# Patient Record
Sex: Female | Born: 1995 | Race: Black or African American | Hispanic: No | Marital: Single
Health system: Midwestern US, Community
[De-identification: ages and names within clinical notes are randomized; demographics above are authoritative.]

## PROBLEM LIST (undated history)

## (undated) DIAGNOSIS — J029 Acute pharyngitis, unspecified: Secondary | ICD-10-CM

## (undated) DIAGNOSIS — T7840XA Allergy, unspecified, initial encounter: Secondary | ICD-10-CM

## (undated) HISTORY — DX: Allergy, unspecified, initial encounter: T78.40XA

## (undated) MED ORDER — NAPROXEN 500 MG TAB
500 mg | ORAL_TABLET | Freq: Two times a day (BID) | ORAL | Status: AC
Start: ? — End: 2012-03-06

---
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
## 2015-01-08 ENCOUNTER — Ambulatory Visit (INDEPENDENT_AMBULATORY_CARE_PROVIDER_SITE_OTHER): Payer: BLUE CROSS/BLUE SHIELD | Admitting: Urgent Care

## 2015-01-08 ENCOUNTER — Ambulatory Visit (INDEPENDENT_AMBULATORY_CARE_PROVIDER_SITE_OTHER): Payer: BLUE CROSS/BLUE SHIELD

## 2015-01-08 ENCOUNTER — Encounter: Payer: Self-pay | Admitting: Urgent Care

## 2015-01-08 VITALS — BP 103/69 | HR 81 | Temp 98.7°F | Resp 16 | Ht 66.25 in | Wt 176.0 lb

## 2015-01-08 DIAGNOSIS — R059 Cough, unspecified: Secondary | ICD-10-CM

## 2015-01-08 DIAGNOSIS — R05 Cough: Secondary | ICD-10-CM

## 2015-01-08 DIAGNOSIS — R062 Wheezing: Secondary | ICD-10-CM | POA: Diagnosis not present

## 2015-01-08 DIAGNOSIS — J988 Other specified respiratory disorders: Secondary | ICD-10-CM

## 2015-01-08 DIAGNOSIS — J22 Unspecified acute lower respiratory infection: Secondary | ICD-10-CM

## 2015-01-08 MED ORDER — BENZONATATE 100 MG PO CAPS: 100.0000 mg | ORAL_CAPSULE | Freq: Three times a day (TID) | ORAL | Status: DC | PRN

## 2015-01-08 MED ORDER — HYDROCODONE-HOMATROPINE 5-1.5 MG/5ML PO SYRP: 5.0000 mL | ORAL_SOLUTION | Freq: Every evening | ORAL | Status: DC | PRN

## 2015-01-08 MED ORDER — IPRATROPIUM BROMIDE HFA 17 MCG/ACT IN AERS: 2.0000 | INHALATION_SPRAY | Freq: Four times a day (QID) | RESPIRATORY_TRACT | Status: DC | PRN

## 2015-01-08 NOTE — Progress Notes (Signed)
    MRN: [id number] DOB: 1996-03-19  Subjective:   Michelle Vargas is a 19 y.o. female presenting for chief complaint of Cough and Sore Throat  Reports 3 day history sore throat, worsening productive cough. Cough is worse at night, cough elicits chest pain, is associated with shob, wheezing but without hemoptysis. Has tried thera-flu with some relief. Admits history of seasonal allergies, takes Claritin for this but not lately. Denies fever, n/v, abdominal pain, itchy or watery red eyes, sinus congestion, sinus pain, ear pain, ear drainage, tooth pain. Denies smoking cigarettes. Denies sick contacts. Denies any other aggravating or relieving factors, no other questions or concerns.  [employer] has a current medication list which includes the following prescription(s): norgestimate-ethinyl estradiol triphasic, famotidine, and prednisone. Also is allergic to coconut oil and peanuts.  Michelle Vargas  has a past medical history of Allergy. Also  has no past surgical history on file.  Objective:   Vitals: BP 103/69 mmHg  Pulse 81  Temp(Src) 98.7 F (37.1 C) (Oral)  Resp 16  Ht 5' 6.25" (1.683 m)  Wt 176 lb (79.833 kg)  BMI 28.18 kg/m2  LMP 12/26/2014  Physical Exam  Constitutional: She is oriented to person, place, and time. She appears well-developed and well-nourished.  HENT:  TM's intact bilaterally, no effusions or erythema. Nares patent, nasal turbinates pink and moist, nasal passages patent. No sinus tenderness. Oropharynx clear, mucous membranes moist, dentition in good repair.  Eyes: Conjunctivae are normal. Right eye exhibits no discharge. Left eye exhibits no discharge. No scleral icterus.  Neck: Normal range of motion. Neck supple.  Cardiovascular: Normal rate, regular rhythm and intact distal pulses.  Exam reveals no gallop and no friction rub.   No murmur heard. Pulmonary/Chest: No respiratory distress. She has no wheezes. She has no rales.  Poor respiratory effort. Patient is wheezing  through laryngospasm.  Lymphadenopathy:    She has no cervical adenopathy.  Neurological: She is alert and oriented to person, place, and time.  Skin: Skin is warm and dry. No rash noted. No erythema. No pallor.   [facility] reading (PRIMARY) by  Dr. Katrinka Vargas and PA-Michelle Vargas. Chest - no acute process.  Assessment and Plan :   1. Lower respiratory infection (e.g., bronchitis, pneumonia, pneumonitis, pulmonitis) 2. Cough 3. Wheezing - Likely undergoing viral process. Advised supportive care. Patient to call or rtc in ~1 week if no improvement, consider antibiotic course at that point.  Wallis Bamberg, PA-C [facility] Michelle Varg[facility] [phone] 01/08/2015 3:51 PM

## 2015-01-08 NOTE — Patient Instructions (Signed)
Cough, Adult  A cough is a reflex that helps clear your throat and airways. It can help heal the body or may be a reaction to an irritated airway. A cough may only last 2 or 3 weeks (acute) or may last more than 8 weeks (chronic).  CAUSES Acute cough:  Viral or bacterial infections. Chronic cough:  Infections.  Allergies.  Asthma.  Post-nasal drip.  Smoking.  Heartburn or acid reflux.  Some medicines.  Chronic lung problems (COPD).  Cancer. SYMPTOMS   Cough.  Fever.  Chest pain.  Increased breathing rate.  High-pitched whistling sound when breathing (wheezing).  Colored mucus that you cough up (sputum). TREATMENT   A bacterial cough may be treated with antibiotic medicine.  A viral cough must run its course and will not respond to antibiotics.  Your caregiver may recommend other treatments if you have a chronic cough. HOME CARE INSTRUCTIONS   Only take over-the-counter or prescription medicines for pain, discomfort, or fever as directed by your caregiver. Use cough suppressants only as directed by your caregiver.  Use a cold steam vaporizer or humidifier in your bedroom or home to help loosen secretions.  Sleep in a semi-upright position if your cough is worse at night.  Rest as needed.  Stop smoking if you smoke. SEEK IMMEDIATE MEDICAL CARE IF:   You have pus in your sputum.  Your cough starts to worsen.  You cannot control your cough with suppressants and are losing sleep.  You begin coughing up blood.  You have difficulty breathing.  You develop pain which is getting worse or is uncontrolled with medicine.  You have a fever. MAKE SURE YOU:   Understand these instructions.  Will watch your condition.  Will get help right away if you are not doing well or get worse. Document Released: 09/20/2010 Document Revised: 06/16/2011 Document Reviewed: 09/20/2010 ExitCare Patient Information 2015 [employer]. This information is not intended  to replace advice given to you by your health care provider. Make sure you discuss any questions you have with your health care provider.  

## 2015-01-10 NOTE — Progress Notes (Signed)
Outgoing call made to patient. Left message to return call. Due for Well Adult Check.

## 2015-04-06 ENCOUNTER — Encounter: Attending: Family | Primary: Internal Medicine

## 2015-04-10 ENCOUNTER — Encounter: Attending: Internal Medicine | Primary: Internal Medicine

## 2015-04-11 ENCOUNTER — Ambulatory Visit
Admit: 2015-04-11 | Discharge: 2015-04-11 | Payer: PRIVATE HEALTH INSURANCE | Attending: Family | Primary: Internal Medicine

## 2015-04-11 DIAGNOSIS — L309 Dermatitis, unspecified: Secondary | ICD-10-CM

## 2015-04-11 MED ORDER — TRIAMCINOLONE ACETONIDE 0.5 % TOPICAL CREAM
0.5 % | Freq: Two times a day (BID) | CUTANEOUS | 0 refills | Status: AC
Start: 2015-04-11 — End: ?

## 2015-04-11 NOTE — Progress Notes (Signed)
HISTORY OF PRESENT ILLNESS  Yesenia Rice is a 20 y.o. female presents for evaluation of the following  HPI  Itchy lesion left wrist for ~ 1 month. She denies contact to know allergen. + history of eczema. OTC anti itch cream somewhat effective    Past Medical History   Diagnosis Date   ??? Allergy to peanuts    ??? Bronchitis chronic    ??? IGT (impaired glucose tolerance)    ??? Influenza A      6/09   ??? 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 RSV (acute bronchiolitis due to respiratory syncytial virus)    ??? Vitamin D deficiency    ??? Wears glasses      to read - far sighted       Current Outpatient Prescriptions on File Prior to Visit   Medication Sig Dispense Refill   ??? norgestimate-ethinyl estradiol (TRI-SPRINTEC, 28,) 0.18/0.215/0.25 mg-35 mcg (28) tablet Take 1 Tab by mouth daily. 1 Each      No current facility-administered medications on file prior to visit.      Review of Systems   Constitutional: Negative.    HENT: Negative.    Eyes: Negative.    Respiratory: Negative.    Cardiovascular: Negative.    Skin: Positive for itching and rash.       Physical Exam   Constitutional: She appears well-developed and well-nourished. No distress.   Cardiovascular: Normal rate and regular rhythm.    Pulmonary/Chest: Effort normal and breath sounds normal.   Skin: Skin is warm and dry. She is not diaphoretic.            ASSESSMENT and PLAN    ICD-10-CM ICD-9-CM    1. Dermatitis L30.9 692.9 triamcinolone (ARISTOCORT) 0.5 % topical cream     Follow-up Disposition:  Return if symptoms worsen or fail to improve.  reviewed medications and side effects in detail    Discussed with patient likely eczema rash

## 2015-04-11 NOTE — Progress Notes (Signed)
Chief Complaint   Patient presents with   ??? Rash     left wrist x1 month, seems to be spreading     PHQ 2 / 9, over the last two weeks 04/11/2015   Little interest or pleasure in doing things Not at all   Feeling down, depressed or hopeless Not at all   Total Score PHQ 2 0     Room 9

## 2015-10-29 ENCOUNTER — Encounter: Payer: Self-pay | Admitting: Physician Assistant

## 2015-10-29 ENCOUNTER — Ambulatory Visit (INDEPENDENT_AMBULATORY_CARE_PROVIDER_SITE_OTHER): Payer: BLUE CROSS/BLUE SHIELD | Admitting: Physician Assistant

## 2015-10-29 VITALS — BP 120/74 | HR 81 | Temp 98.7°F | Ht 66.0 in | Wt 174.0 lb

## 2015-10-29 DIAGNOSIS — R599 Enlarged lymph nodes, unspecified: Secondary | ICD-10-CM

## 2015-10-29 DIAGNOSIS — R7302 Impaired glucose tolerance (oral): Secondary | ICD-10-CM | POA: Insufficient documentation

## 2015-10-29 DIAGNOSIS — R590 Localized enlarged lymph nodes: Secondary | ICD-10-CM

## 2015-10-29 DIAGNOSIS — E559 Vitamin D deficiency, unspecified: Secondary | ICD-10-CM | POA: Insufficient documentation

## 2015-10-29 LAB — POCT CBC
GRANULOCYTE PERCENT: 57.2 % (ref 37–80)
HCT, POC: 38.1 % (ref 37.7–47.9)
HEMOGLOBIN: 13.3 g/dL (ref 12.2–16.2)
Lymph, poc: 3.4 (ref 0.6–3.4)
MCH: 29.9 pg (ref 27–31.2)
MCHC: 35 g/dL (ref 31.8–35.4)
MCV: 85.3 fL (ref 80–97)
MID (cbc): 0.4 (ref 0–0.9)
MPV: 7.3 fL (ref 0–99.8)
PLATELET COUNT, POC: 356 10*3/uL (ref 142–424)
POC Granulocyte: 5.1 (ref 2–6.9)
POC LYMPH PERCENT: 38.4 %L (ref 10–50)
POC MID %: 4.4 %M (ref 0–12)
RBC: 4.46 M/uL (ref 4.04–5.48)
RDW, POC: 13.7 %
WBC: 8.9 10*3/uL (ref 4.6–10.2)

## 2015-10-29 NOTE — Progress Notes (Signed)
[facility] [address] [place] [address] [phone]  Date:  10/29/2015   Name:  Michelle Vargas   DOB:  1995/11/21   MRN:  [id number]  PCP:  Pcp Not In System    Chief Complaint: Edema (Neck. since yesterday )   History of Present Illness:  This is a 20 y.o. female with PMH Vit D def, impaired glucose tolerance who is presenting with a swollen lymph node. She noticed it 2 days ago, under chin. She states it doesn't move. Sometimes a little sore but overall painless. She feels ok. States she has chronic sinus issues but they aren't too bad now. She denies fever, chills, sore throat, cough. States her teeth often feel sensitive to cold fluids but that applies to all her teeth. No teeth pain. No painful lesions in mouth. Denies night sweats, decreased appetite, wt loss. States she had full STD testing including HIV 1 month ago that was negative. She is sexually active with a long-term female partner.  Review of Systems:  Review of Systems See HPI  Patient Active Problem List   Diagnosis Date Noted  . Vitamin D deficiency 10/29/2015  . IGT (impaired glucose tolerance) 10/29/2015  . Family history of systemic lupus erythematosus 08/28/2014    Prior to Admission medications   Medication Sig Start Date End Date Taking? Authorizing Provider  Norgestimate-Ethinyl Estradiol Triphasic (TRI-SPRINTEC) 0.18/0.215/0.25 MG-35 MCG tablet Take 1 tablet by mouth daily.   Yes Historical Provider, MD    Allergies  Allergen Reactions  . Coconut Oil Hives  . Cat Hair Extract Swelling  . Peanuts [Peanut Oil]     hives    No past surgical history on file.  Social History  Substance Use Topics  . Smoking status: Never Smoker  . Smokeless tobacco: Not on file  . Alcohol use No    Family History  Problem Relation Age of Onset  . Diabetes Father     PREDIABETES  . Diabetes Sister   . Cancer Maternal Grandmother     HAD BREAST  . Cancer Paternal Grandmother    BREAST  . Diabetes Paternal Grandmother   . Diabetes Paternal Grandfather   . Heart disease Paternal Grandfather     Medication list has been reviewed and updated.  Physical Examination:  Physical Exam  Constitutional: She is oriented to person, place, and time. She appears well-developed and well-nourished. No distress.  HENT:  Head: Normocephalic and atraumatic.  Right Ear: Hearing, tympanic membrane, external ear and ear canal normal.  Left Ear: Hearing, tympanic membrane, external ear and ear canal normal.  Nose: Nose normal.  Mouth/Throat: Uvula is midline, oropharynx is clear and moist and mucous membranes are normal. No oral lesions. Normal dentition. No dental abscesses. No oropharyngeal exudate, posterior oropharyngeal edema or posterior oropharyngeal erythema.  Eyes: Conjunctivae and lids are normal. Right eye exhibits no discharge. Left eye exhibits no discharge. No scleral icterus.  Neck: No thyromegaly present.  Cardiovascular: Normal rate, regular rhythm, normal heart sounds and normal pulses.   No murmur heard. Pulmonary/Chest: Effort normal and breath sounds normal. No respiratory distress. She has no wheezes. She has no rhonchi. She has no rales.  Musculoskeletal: Normal range of motion.  Lymphadenopathy:       Head (right side): No submandibular, no tonsillar, no preauricular, no posterior auricular and no occipital adenopathy present.       Head (left side): No submandibular, no tonsillar, no preauricular, no posterior auricular and no occipital  adenopathy present.    She has no cervical adenopathy.    She has no axillary adenopathy.       Right: No supraclavicular adenopathy present.       Left: No supraclavicular adenopathy present.  1-2 cm firm immobile submental mass. Nontender.   Neurological: She is alert and oriented to person, place, and time.  Skin: Skin is warm, dry and intact. No lesion and no rash noted.  Psychiatric: She has a normal mood and affect.  Her speech is normal and behavior is normal. Thought content normal.   BP 120/74 (BP Location: Right Arm)   Pulse 81   Temp 98.7 F (37.1 C) (Oral)   Ht  (1.676 m)   Wt 174 lb (78.9 kg)   LMP 10/08/2015 (Approximate)   SpO2 (!) 82%   BMI 28.08 kg/m   Results for orders placed or performed in visit on 10/29/15  POCT CBC  Result Value Ref Range   WBC 8.9 4.6 - 10.2 K/uL   Lymph, poc 3.4 0.6 - 3.4   POC LYMPH PERCENT 38.4 10 - 50 %L   MID (cbc) 0.4 0 - 0.9   POC MID % 4.4 0 - 12 %M   POC Granulocyte 5.1 2 - 6.9   Granulocyte percent 57.2 37 - 80 %G   RBC 4.46 4.04 - 5.48 M/uL   Hemoglobin 13.3 12.2 - 16.2 g/dL   HCT, POC 16.1 09.6 - 47.9 %   MCV 85.3 80 - 97 fL   MCH, POC 29.9 27 - 31.2 pg   MCHC 35.0 31.8 - 35.4 g/dL   RDW, POC 04.5 %   Platelet Count, POC 356 142 - 424 K/uL   MPV 7.3 0 - 99.8 fL    Assessment and Plan:  1. Submental adenopathy CBC wnl. Exam normal other than lymphadenopathy. EBV pending. U/S ordered to eval further. - POCT CBC - Epstein-Barr virus VCA antibody panel - US Soft Tissue Head/Neck; Future   [place] [employer]. Dyke Brackett, MHS [facility]  10/29/2015

## 2015-10-29 NOTE — Patient Instructions (Addendum)
I will call you with your lab results. Drink plenty of water and get plenty of rest. You will get a phone call to make appt for neck ultrasound    IF you received an x-ray today, you will receive an invoice from [facility]. Please contact [facility] at [phone] with questions or concerns regarding your invoice.   IF you received labwork today, you will receive an invoice from [employer]. Please contact [facility] at [phone] with questions or concerns regarding your invoice.   Our billing staff will not be able to assist you with questions regarding bills from these companies.  You will be contacted with the lab results as soon as they are available. The fastest way to get your results is to activate your My Chart account. Instructions are located on the last page of this paperwork. If you have not heard from [place] regarding the results in 2 weeks, please contact this office.

## 2015-10-30 LAB — EPSTEIN-BARR VIRUS VCA ANTIBODY PANEL
EBV NA IgG: >600 U/mL — ABNORMAL HIGH
EBV VCA IGM: 60.4 U/mL — AB
EBV VCA IgG: 550 U/mL — ABNORMAL HIGH

## 2015-11-02 ENCOUNTER — Telehealth: Payer: Self-pay

## 2015-11-03 ENCOUNTER — Telehealth: Payer: Self-pay

## 2015-11-03 NOTE — Telephone Encounter (Signed)
The patient called to request her lab results from her office visit on 10/29/15.  Please call patient once the labs have been reviewed by the provider.  CB#: [phone]

## 2015-11-04 NOTE — Telephone Encounter (Signed)
Please call pt and let her know her results. Blood counts were normal Mono titers suggest a recent mono infection, which could be the cause of her swollen lymph node. Mono is contagious by contact with saliva. She should focus on getting adequate rest and hydration. This virus and her symptoms will resolve on own with time. This virus frequently enlarges the spleen and the only major risk is spleen rupture with trauma. (the risk only lasts about 1 month since the start of symptoms). She should be diligent about wearing her seatbelt and abstain from contact sports for the next 1 month. I would like her to keep her appt for the neck ultrasound, just to be on the safe side. Let me know if she has any questions.

## 2015-11-05 NOTE — Telephone Encounter (Signed)
Pt advised.

## 2015-11-08 ENCOUNTER — Other Ambulatory Visit: Payer: Self-pay

## 2015-12-05 ENCOUNTER — Other Ambulatory Visit: Payer: Self-pay

## 2016-12-21 IMAGING — CR DG CHEST 2V
2 series · 2 of 2 positions shown · non-contrast
Comparison: None.

CLINICAL DATA: Cough and wheezing.

EXAM:
CHEST  2 VIEW

[PA]
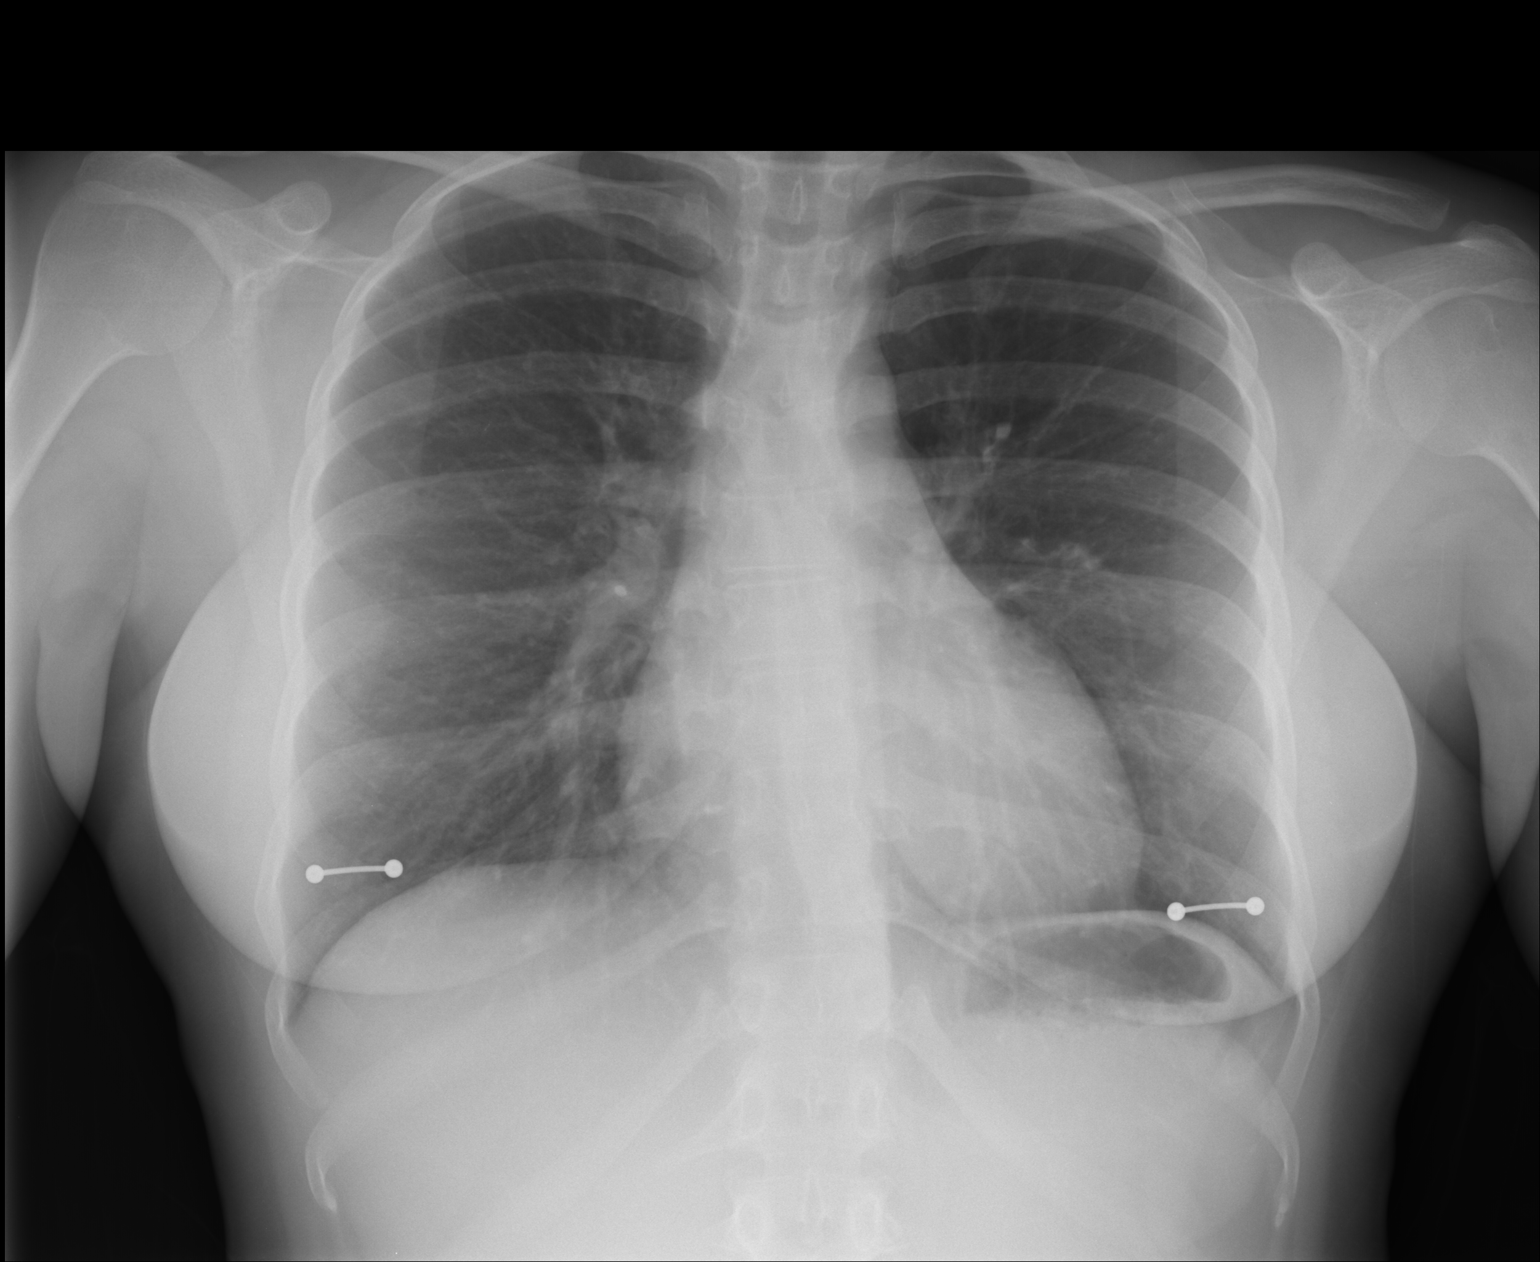

[lateral]
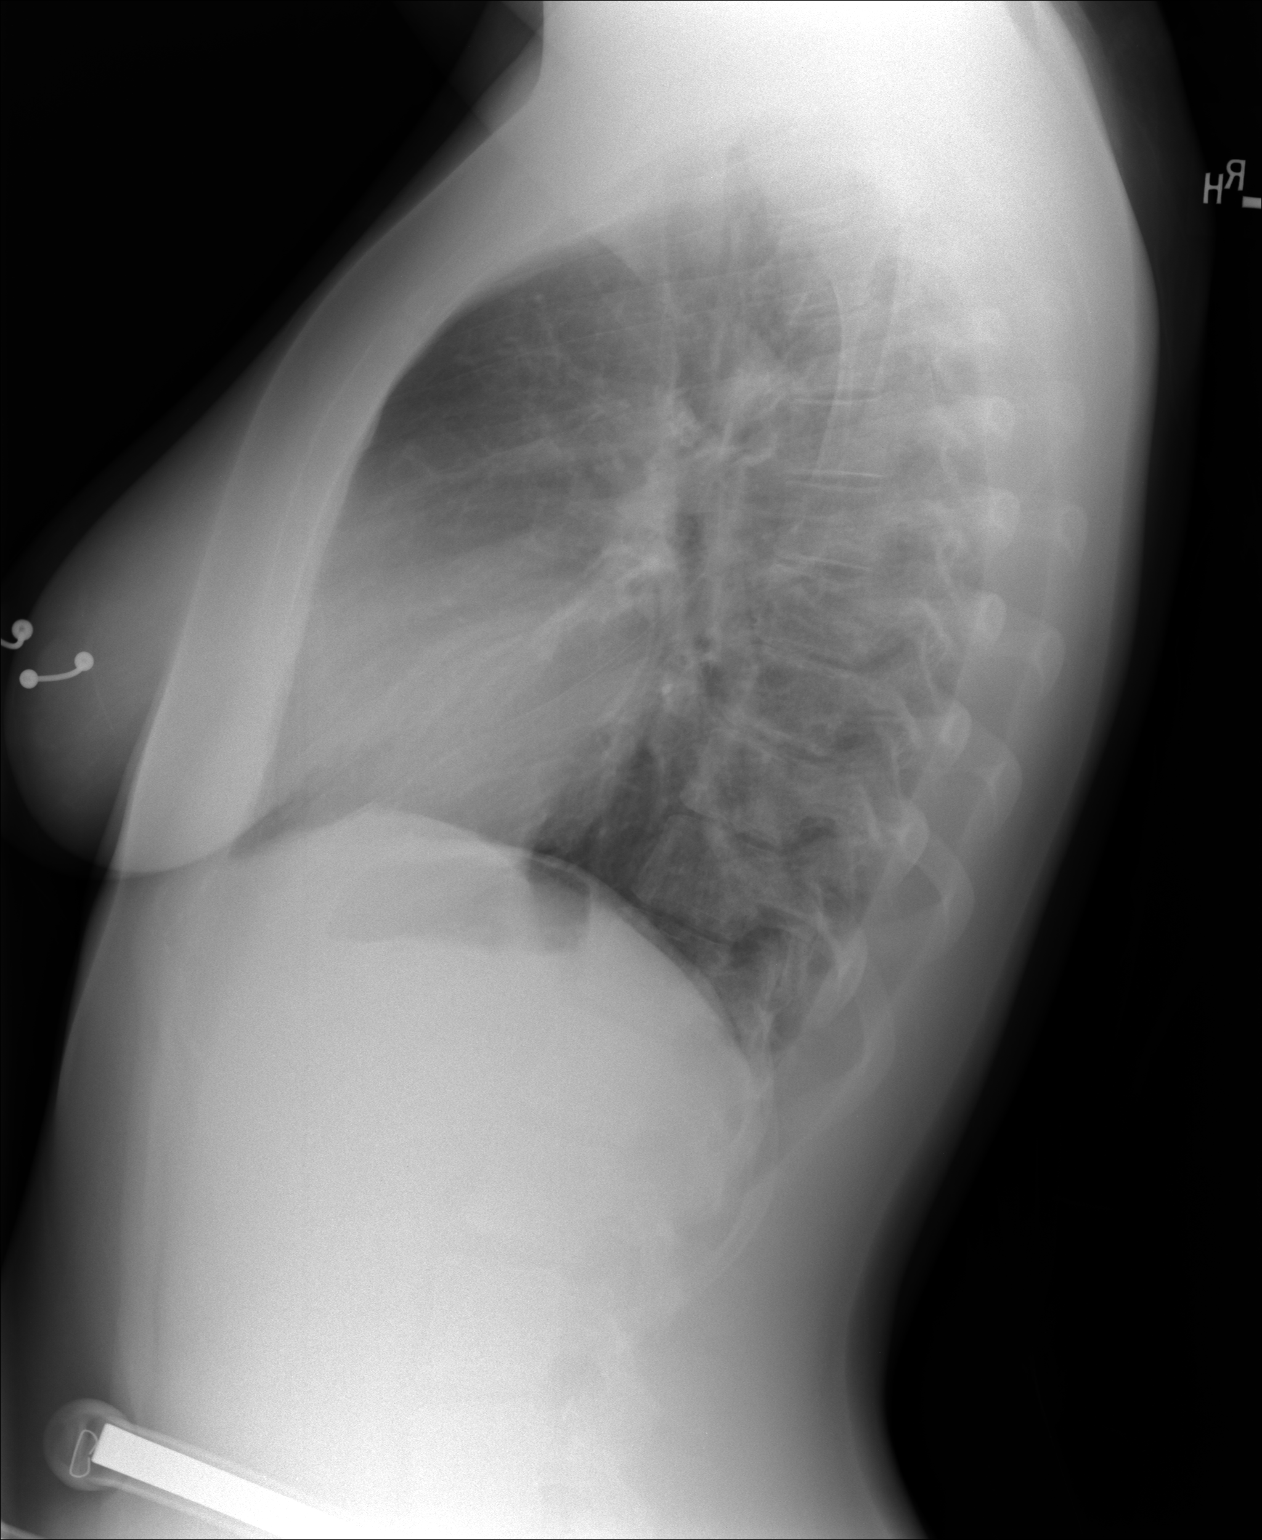

[2 of 2 positions shown; findings below may reference images not displayed]

FINDINGS: Cardiomediastinal silhouette is normal. Mediastinal contours appear
intact.

There is no evidence of focal airspace consolidation, pleural
effusion or pneumothorax.

Osseous structures are without acute abnormality. Body jewelry
overlies the nipples. Soft tissues are otherwise grossly normal.
IMPRESSION: No active cardiopulmonary disease.

## 2017-07-17 ENCOUNTER — Encounter (HOSPITAL_COMMUNITY): Payer: Self-pay | Admitting: Obstetrics and Gynecology

## 2017-12-22 NOTE — Telephone Encounter (Signed)
-----   Message from Alvira Philipsenarda S Bacon sent at 12/22/2017 10:29 AM EDT -----  Regarding: NP [place]/Telephone  General Message/Vendor Calls    Caller's first and last name:  Karin Lieuaylor Pau      Reason for call: cpe & immunization records picked up from practice 12/24/17 or 12/25/17.      Callback required yes/no and why: yes      Best contact number(s):[phone]      Details to clarify the request:      Alvira Philipsenarda S Bacon

## 2017-12-22 NOTE — Telephone Encounter (Signed)
Called pt but was unable to lvm due to mailbox being full. Pt has not had a physical since 2015 and pt has not been seen since 2017 if they are requesting a recent or valid physical previous physical is now expired. A release form needs to be signed for us to release  information and print immunizations
